# Patient Record
Sex: Male | Born: 1945 | Race: White | Hispanic: No | Marital: Married | Smoking: Never smoker
Health system: Southern US, Community
[De-identification: ages and names within clinical notes are randomized; demographics above are authoritative.]

## PROBLEM LIST (undated history)

## (undated) DIAGNOSIS — N400 Enlarged prostate without lower urinary tract symptoms: Secondary | ICD-10-CM

## (undated) DIAGNOSIS — N189 Chronic kidney disease, unspecified: Secondary | ICD-10-CM

## (undated) DIAGNOSIS — D45 Polycythemia vera: Secondary | ICD-10-CM

## (undated) DIAGNOSIS — E785 Hyperlipidemia, unspecified: Secondary | ICD-10-CM

## (undated) DIAGNOSIS — K5790 Diverticulosis of intestine, part unspecified, without perforation or abscess without bleeding: Secondary | ICD-10-CM

## (undated) HISTORY — PX: UMBILICAL HERNIA REPAIR: SHX196

## (undated) HISTORY — PX: TONSILLECTOMY: SUR1361

## (undated) HISTORY — DX: Diverticulosis of intestine, part unspecified, without perforation or abscess without bleeding: K57.90

## (undated) HISTORY — DX: Benign prostatic hyperplasia without lower urinary tract symptoms: N40.0

## (undated) HISTORY — PX: LYMPH NODE BIOPSY: SHX201

## (undated) HISTORY — DX: Hyperlipidemia, unspecified: E78.5

## (undated) HISTORY — DX: Chronic kidney disease, unspecified: N18.9

## (undated) HISTORY — DX: Polycythemia vera: D45

---

## 2020-06-26 ENCOUNTER — Other Ambulatory Visit (HOSPITAL_BASED_OUTPATIENT_CLINIC_OR_DEPARTMENT_OTHER): Payer: Self-pay | Admitting: Internal Medicine

## 2020-06-26 ENCOUNTER — Ambulatory Visit (HOSPITAL_BASED_OUTPATIENT_CLINIC_OR_DEPARTMENT_OTHER)
Admission: RE | Admit: 2020-06-26 | Discharge: 2020-06-26 | Disposition: A | Discharge status: Home / Self Care | Payer: Medicare Other | Source: Ambulatory Visit | Attending: Internal Medicine | Admitting: Internal Medicine

## 2020-06-26 ENCOUNTER — Other Ambulatory Visit: Payer: Self-pay

## 2020-06-26 DIAGNOSIS — R1031 Right lower quadrant pain: Secondary | ICD-10-CM

## 2020-06-29 ENCOUNTER — Telehealth: Payer: Self-pay | Admitting: Hematology and Oncology

## 2020-06-29 NOTE — Telephone Encounter (Signed)
Scheduled per referral. Called and spoke with pt, confirmed 6/8 appts. Pt is aware to arrive 84mins before appt and to bring photo ID and insurance card

## 2020-07-12 ENCOUNTER — Inpatient Hospital Stay: Payer: Medicare Other

## 2020-07-12 ENCOUNTER — Other Ambulatory Visit: Payer: Self-pay

## 2020-07-12 ENCOUNTER — Other Ambulatory Visit: Payer: Self-pay | Admitting: *Deleted

## 2020-07-12 ENCOUNTER — Encounter: Payer: Self-pay | Admitting: Hematology and Oncology

## 2020-07-12 ENCOUNTER — Inpatient Hospital Stay: Payer: Medicare Other | Attending: Hematology and Oncology | Admitting: Hematology and Oncology

## 2020-07-12 VITALS — BP 137/98 | HR 84 | Temp 98.3°F | Resp 18 | Wt 201.1 lb

## 2020-07-12 DIAGNOSIS — D45 Polycythemia vera: Secondary | ICD-10-CM

## 2020-07-12 DIAGNOSIS — Z79899 Other long term (current) drug therapy: Secondary | ICD-10-CM | POA: Diagnosis not present

## 2020-07-12 DIAGNOSIS — Z87438 Personal history of other diseases of male genital organs: Secondary | ICD-10-CM

## 2020-07-12 DIAGNOSIS — N189 Chronic kidney disease, unspecified: Secondary | ICD-10-CM | POA: Insufficient documentation

## 2020-07-12 LAB — IRON AND TIBC
Iron: 122 ug/dL (ref 42–163)
Saturation Ratios: 32 % (ref 20–55)
TIBC: 386 ug/dL (ref 202–409)
UIBC: 264 ug/dL (ref 117–376)

## 2020-07-12 LAB — CMP (CANCER CENTER ONLY)
ALT: 17 U/L (ref 0–44)
AST: 22 U/L (ref 15–41)
Albumin: 4.1 g/dL (ref 3.5–5.0)
Alkaline Phosphatase: 68 U/L (ref 38–126)
Anion gap: 12 (ref 5–15)
BUN: 22 mg/dL (ref 8–23)
CO2: 29 mmol/L (ref 22–32)
Calcium: 10.7 mg/dL — ABNORMAL HIGH (ref 8.9–10.3)
Chloride: 105 mmol/L (ref 98–111)
Creatinine: 1.64 mg/dL — ABNORMAL HIGH (ref 0.61–1.24)
GFR, Estimated: 44 mL/min — ABNORMAL LOW (ref >60)
Glucose, Bld: 103 mg/dL — ABNORMAL HIGH (ref 70–99)
Potassium: 4.4 mmol/L (ref 3.5–5.1)
Sodium: 146 mmol/L — ABNORMAL HIGH (ref 135–145)
Total Bilirubin: 0.9 mg/dL (ref 0.3–1.2)
Total Protein: 7.1 g/dL (ref 6.5–8.1)

## 2020-07-12 LAB — RETIC PANEL
Immature Retic Fract: 10.5 % (ref 2.3–15.9)
RBC.: 5.23 MIL/uL (ref 4.22–5.81)
Retic Count, Absolute: 60.7 10*3/uL (ref 19.0–186.0)
Retic Ct Pct: 1.2 % (ref 0.4–3.1)
Reticulocyte Hemoglobin: 35.7 pg (ref >27.9)

## 2020-07-12 LAB — CBC WITH DIFFERENTIAL (CANCER CENTER ONLY)
Abs Immature Granulocytes: 0.08 10*3/uL — ABNORMAL HIGH (ref 0.00–0.07)
Basophils Absolute: 0 10*3/uL (ref 0.0–0.1)
Basophils Relative: 1 %
Eosinophils Absolute: 0.1 10*3/uL (ref 0.0–0.5)
Eosinophils Relative: 2 %
HCT: 48 % (ref 39.0–52.0)
Hemoglobin: 16.6 g/dL (ref 13.0–17.0)
Immature Granulocytes: 2 %
Lymphocytes Relative: 26 %
Lymphs Abs: 1.3 10*3/uL (ref 0.7–4.0)
MCH: 31.7 pg (ref 26.0–34.0)
MCHC: 34.6 g/dL (ref 30.0–36.0)
MCV: 91.8 fL (ref 80.0–100.0)
Monocytes Absolute: 0.4 10*3/uL (ref 0.1–1.0)
Monocytes Relative: 8 %
Neutro Abs: 3.1 10*3/uL (ref 1.7–7.7)
Neutrophils Relative %: 61 %
Platelet Count: 287 10*3/uL (ref 150–400)
RBC: 5.23 MIL/uL (ref 4.22–5.81)
RDW: 13 % (ref 11.5–15.5)
WBC Count: 5 10*3/uL (ref 4.0–10.5)
nRBC: 0 % (ref 0.0–0.2)

## 2020-07-12 LAB — FERRITIN: Ferritin: 52 ng/mL (ref 24–336)

## 2020-07-12 NOTE — Progress Notes (Signed)
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 Carolin Sicks. Labs showed Hgb 16.5, MCV 89.8, HCT 47.9. Patient followed her for 27 years 07/12/2020: establish care with Dr. Lorenso Courier   CHIEF COMPLAINTS/PURPOSE OF CONSULTATION:  "PV "  HISTORY OF PRESENTING ILLNESS:  Frank Harper 75 y.o. male with medical history significant for polycythemia vera and chronic kidney disease who presents for establishing care.  On review of the previous records Frank Harper previously followed with [facility] oncology with Dr. Carolin Sicks in [place].  He followed with his providers for 27 years, however now he is moving to [place] and needs to establish care.  He has been managed with phlebotomy and a goal hematocrit of less than 50%.  He has never been trialed on any medications for his polycythemia vera.   On exam today Frank Harper reports that he has been offered hydroxyurea therapy in the past but that he is concerned about the increased risk of leukemia from this medication.  He also reports that he is concerned about the "spent phase" of his polycythemia vera.  His target hematocrit is 50% and he notes he maintains this with periodic phlebotomies.  He currently takes a baby aspirin once daily.  On further discussion he is a never smoker and drinks alcohol about once every 2 weeks.  He previously worked as a Education officer, museum.  His family history has no remarkable blood diseases, blood disorders, or cancer.  He is currently married with 2 children 1 of which lives in [place] and is the reason he is here.  He notes that he did have episodes of abdominal pain and recently had a CT scan performed which showed diverticulitis and kidney stones.  He otherwise denies any fevers, chills, sweats, nausea, vomiting or diarrhea.   Full 10 point ROS is listed below.  MEDICAL HISTORY:  Past Medical History:  Diagnosis Date  . CKD (chronic kidney disease)   . Enlarged prostate   . Polycythemia vera ([place])     SURGICAL HISTORY: Past Surgical History:  Procedure Laterality Date  . HERNIA REPAIR  1992  . HERNIA REPAIR  1997  . LYMPH NODE BIOPSY      SOCIAL HISTORY: Social History   Socioeconomic History  . Marital status: Married    Spouse name: Not on file  . Number of children: 2  . Years of education: Not on file  . Highest education level: Not on file  Occupational History  . Occupation: Retired Education officer, museum  Tobacco Use  . Smoking status: Never Smoker  . Smokeless tobacco: Never Used  Vaping Use  . Vaping Use: Never used  Substance and Sexual Activity  . Alcohol use: Yes    Alcohol/week: 1.0 standard drink    Types: 1 Standard drinks or equivalent per week  . Drug use: Not on file  . Sexual activity: Not on file  Other Topics Concern  . Not on file  Social History Narrative  . Not on file   Social Determinants of Health   Financial Resource Strain: Not on file  Food Insecurity: Not on file  Transportation Needs: Not on file  Physical Activity: Not on file  Stress: Not on file  Social Connections: Not on file  Intimate Partner Violence: Not on file    FAMILY HISTORY: Family History  Problem  Relation Age of Onset  . Diabetes Mother   . Aneurysm Father     ALLERGIES:  has no allergies on file.  MEDICATIONS:  Current Outpatient Medications  Medication Sig Dispense Refill  . co-enzyme Q-10 30 MG capsule Take 30 mg by mouth daily.    Marland Kitchen glucosamine-chondroitin 500-400 MG tablet Take 1 tablet by mouth daily.    . Multiple Vitamin (MULTIVITAMIN) tablet Take 1 tablet by mouth daily.    . Multiple Vitamins-Minerals (MACULAR HEALTH FORMULA PO) Take by mouth.    . Saw Palmetto, Serenoa repens, (SAW PALMETTO PO) Take by mouth.    . tamsulosin (FLOMAX) 0.4 MG CAPS capsule Take 0.4 mg by  mouth daily.     No current facility-administered medications for this visit.    REVIEW OF SYSTEMS:   Constitutional: ( - ) fevers, ( - )  chills , ( - ) night sweats Eyes: ( - ) blurriness of vision, ( - ) double vision, ( - ) watery eyes Ears, nose, mouth, throat, and face: ( - ) mucositis, ( - ) sore throat Respiratory: ( - ) cough, ( - ) dyspnea, ( - ) wheezes Cardiovascular: ( - ) palpitation, ( - ) chest discomfort, ( - ) lower extremity swelling Gastrointestinal:  ( - ) nausea, ( - ) heartburn, ( - ) change in bowel habits Skin: ( - ) abnormal skin rashes Lymphatics: ( - ) new lymphadenopathy, ( - ) easy bruising Neurological: ( - ) numbness, ( - ) tingling, ( - ) new weaknesses Behavioral/Psych: ( - ) mood change, ( - ) new changes  All other systems were reviewed with the patient and are negative.  PHYSICAL EXAMINATION: ECOG PERFORMANCE STATUS: 0 - Asymptomatic  Vitals:   07/12/20 0901  BP: (!) 137/98  Pulse: 84  Resp: 18  Temp: 98.3 F (36.8 C)  SpO2: 99%   Filed Weights   07/12/20 0901  Weight: 201 lb 1.6 oz (91.2 kg)    GENERAL: well appearing elderly Caucasian male in NAD  SKIN: skin color, texture, turgor are normal, no rashes or significant lesions EYES: conjunctiva are pink and non-injected, sclera clear LUNGS: clear to auscultation and percussion with normal breathing effort HEART: regular rate & rhythm and no murmurs and no lower extremity edema Musculoskeletal: no cyanosis of digits and no clubbing  PSYCH: alert & oriented x 3, fluent speech NEURO: no focal motor/sensory deficits  LABORATORY DATA:  I have reviewed the data as listed No flowsheet data found.  No flowsheet data found.  RADIOGRAPHIC STUDIES: CT ABDOMEN PELVIS WO CONTRAST  Result Date: 06/27/2020 CLINICAL DATA:  Right lower quadrant discomfort, tenderness, and pain for 1 month. EXAM: CT ABDOMEN AND PELVIS WITHOUT CONTRAST TECHNIQUE: Multidetector CT imaging of the abdomen and pelvis  was performed following the standard protocol without IV contrast. COMPARISON:  None. FINDINGS: Lower chest: The lung bases are clear. No acute airspace disease or pleural effusion. Heart is normal in size. Coronary artery calcifications. Trace pericardial effusion. Hepatobiliary: No focal liver abnormality is seen. No gallstones, gallbladder wall thickening, or biliary dilatation. Pancreas: No ductal dilatation or inflammation. No evidence of pancreatic mass on this unenhanced exam. Spleen: Normal in size without focal abnormality. Adrenals/Urinary Tract: Normal adrenal glands. Multiple bilateral intrarenal calculi. Largest stones in the right kidney measures 6 mm in the lower pole. Largest stone in the left kidney measures 6 mm in the lower renal pelvis. There is no hydronephrosis. No ureteral stones. Mild symmetric bilateral perinephric edema.  5 cm water density lesion in the lower left kidney consistent with cyst. No evidence of solid lesion. Urinary bladder is partially distended. There is no bladder wall thickening or stone. Stomach/Bowel: Tiny hiatal hernia. Stomach otherwise unremarkable. Normal small bowel without obstruction, wall thickening or inflammation. Normal terminal ileum. Normal appendix, series 2, image 54 moderate volume of stool throughout the colon. Diverticulosis from the splenic flexure distally. There is no inflamed diverticulum in the proximal sigmoid colon in the left lower quadrant, series 2, image 68, consistent with acute diverticulitis. No perforation or abscess. Vascular/Lymphatic: Moderate aortic atherosclerosis. No aortic aneurysm. 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 No adjacent stranding. No enlarged lymph nodes in the abdomen or pelvis. Reproductive: Prominent prostate gland spans 5.4 cm causing mild mass effect on the bladder base. Other: No free air or abscess. No free fluid. Tiny fat containing  umbilical hernia. Minimal fat in both inguinal canals. Musculoskeletal: Bilateral L5 pars interarticularis defects with grade 1 anterolisthesis of L5 on S1 and degenerative disc disease with vacuum phenomenon. IMPRESSION: 1. Acute uncomplicated diverticulitis of the proximal sigmoid colon. No perforation or abscess. 2. Bilateral nonobstructing renal calculi. No hydronephrosis or obstructive uropathy. 3. Normal appendix. 4. 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 Aortic Atherosclerosis (ICD10-I70.0). Electronically Signed   By: Keith Rake M.D.   On: 06/27/2020 21:34    ASSESSMENT & PLAN Frank Harper 75 y.o. male with medical history significant for polycythemia vera and chronic kidney disease who presents for establishing care.  After review of the labs, review of the records, and discussion with the patient the patients findings are most consistent with a longstanding polycythemia vera.  From the records I am unable to tell if the patient has a driver mutation.  As such I am ordering a JAK2 with reflex panel today as well as an erythropoietin level.  We will plan to get the patient started up on phlebotomy therapies.  He is not interested in other cytoreductive therapies such as hydroxyurea at this time given the risk for leukemia.  He also would like to maintain a higher threshold goal of a hematocrit of 50%.  Today I discussed the risks and benefits of not taking cytoreductive therapy and of having the slightly higher hematocrit that are typical target.  The patient voiced his understanding and would like to proceed with the plan previously set in place by his oncologist in [place].  #Polycythemia Vera -- Today we will order a JAK2 with reflex panel as well as an erythropoietin level in order to confirm that this is genuinely polycythemia vera and not a secondary polycythemia -- Today we will order CBC, CMP, ferritin,  and iron panel -- We will plan to start every 2 week phlebotomies until we reach target hematocrit.  Standard literature recommends hematocrit goal of less than 45%, however the patient was previously targeting a less than 50% with his previous oncologist.  He would like to continue this -- Patient declined cytoreductive therapy with hydroxyurea given the leukemia risk -- Recommend continuation of baby aspirin 81 mg p.o. daily -- Return to clinic in 6 weeks time for reevaluation.  If stable at that time can consider monthly lab draws with every 3 month clinic visits.  Orders Placed This Encounter  Procedures  . CBC with Differential (Cancer Center Only)    Standing Status:   Future  Number of Occurrences:   1    Standing Expiration Date:   07/12/2021  . CMP ([facility] only)    Standing Status:   Future    Number of Occurrences:   1    Standing Expiration Date:   07/12/2021  . Ferritin    Standing Status:   Future    Number of Occurrences:   1    Standing Expiration Date:   07/12/2021  . Iron and TIBC    Standing Status:   Future    Number of Occurrences:   1    Standing Expiration Date:   07/12/2021  . Retic Panel    Standing Status:   Future    Number of Occurrences:   1    Standing Expiration Date:   07/12/2021  . Erythropoietin    Standing Status:   Future    Number of Occurrences:   1    Standing Expiration Date:   07/12/2021  . JAK2 (INCLUDING V617F AND EXON 12), MPL,& CALR W/RFL MPN PANEL (NGS)    Standing Status:   Future    Number of Occurrences:   1    Standing Expiration Date:   07/12/2021    All questions were answered. The patient knows to call the clinic with any problems, questions or concerns.  A total of more than 60 minutes were spent on this encounter with face-to-face time and non-face-to-face time, including preparing to see the patient, ordering tests and/or medications, counseling the patient and coordination of care as outlined above.   Ledell Peoples,  MD Department of Hematology/Oncology [place] at [facility] Phone: [phone] Pager: [phone] Email: [email or web address]  07/12/2020 9:43 AM

## 2020-07-12 NOTE — Progress Notes (Signed)
ambulatory

## 2020-07-13 LAB — ERYTHROPOIETIN: Erythropoietin: 12.1 m[IU]/mL (ref 2.6–18.5)

## 2020-07-19 ENCOUNTER — Telehealth: Payer: Self-pay | Admitting: Hematology and Oncology

## 2020-07-19 LAB — JAK2 (INCLUDING V617F AND EXON 12), MPL,& CALR W/RFL MPN PANEL (NGS)

## 2020-07-19 NOTE — Telephone Encounter (Signed)
Cancelled per pt request , pt aware

## 2020-07-20 ENCOUNTER — Other Ambulatory Visit: Payer: Medicare Other

## 2020-07-25 ENCOUNTER — Telehealth: Payer: Self-pay | Admitting: Hematology and Oncology

## 2020-07-25 NOTE — Telephone Encounter (Signed)
Cancelled appts per 6/21 sch msg. Called pt, no answer. Left msg for pt to call back to r/s.

## 2020-08-03 ENCOUNTER — Other Ambulatory Visit: Payer: Medicare Other

## 2020-08-17 ENCOUNTER — Ambulatory Visit: Payer: Medicare Other

## 2020-08-17 ENCOUNTER — Ambulatory Visit: Payer: Medicare Other | Admitting: Hematology and Oncology

## 2020-08-17 ENCOUNTER — Other Ambulatory Visit: Payer: Medicare Other

## 2020-08-17 ENCOUNTER — Other Ambulatory Visit: Payer: Self-pay | Admitting: Hematology and Oncology

## 2020-08-17 DIAGNOSIS — D45 Polycythemia vera: Secondary | ICD-10-CM

## 2020-08-18 ENCOUNTER — Telehealth: Payer: Self-pay | Admitting: Hematology and Oncology

## 2020-08-18 NOTE — Telephone Encounter (Signed)
Contacted patient to rescheduled appointment, patient said he did not need nor want to rescheduled appointment, and will call back when he wants appointment.

## 2020-09-05 ENCOUNTER — Encounter: Payer: Self-pay | Admitting: Gastroenterology

## 2020-10-17 ENCOUNTER — Ambulatory Visit: Payer: Medicare Other | Admitting: Gastroenterology

## 2020-11-02 ENCOUNTER — Ambulatory Visit: Payer: Medicare Other | Admitting: Gastroenterology

## 2020-11-07 ENCOUNTER — Telehealth: Payer: Self-pay | Admitting: Hematology and Oncology

## 2020-11-07 NOTE — Telephone Encounter (Signed)
Scheduled per 10/3 sch msg, msg was left with pt.

## 2020-11-15 ENCOUNTER — Encounter: Payer: Self-pay | Admitting: Gastroenterology

## 2020-11-15 ENCOUNTER — Other Ambulatory Visit: Payer: Medicare Other

## 2020-11-15 ENCOUNTER — Ambulatory Visit (INDEPENDENT_AMBULATORY_CARE_PROVIDER_SITE_OTHER): Payer: Medicare Other | Admitting: Gastroenterology

## 2020-11-15 VITALS — BP 118/82 | HR 68 | Ht 72.0 in | Wt 201.0 lb

## 2020-11-15 DIAGNOSIS — Z86010 Personal history of colonic polyps: Secondary | ICD-10-CM

## 2020-11-15 DIAGNOSIS — R1013 Epigastric pain: Secondary | ICD-10-CM | POA: Diagnosis not present

## 2020-11-15 DIAGNOSIS — K5732 Diverticulitis of large intestine without perforation or abscess without bleeding: Secondary | ICD-10-CM

## 2020-11-15 DIAGNOSIS — K219 Gastro-esophageal reflux disease without esophagitis: Secondary | ICD-10-CM | POA: Diagnosis not present

## 2020-11-15 NOTE — Patient Instructions (Signed)
If you are age 73 or older, your body mass index should be between 23-30. Your Body mass index is 27.26 kg/m. If this is out of the aforementioned range listed, please consider follow up with your Primary Care Provider.  If you are age 45 or younger, your body mass index should be between 19-25. Your Body mass index is 27.26 kg/m. If this is out of the aformentioned range listed, please consider follow up with your Primary Care Provider.   Your provider has requested that you go to the basement level for lab work before leaving today. Press "B" on the elevator. The lab is located at the first door on the left as you exit the elevator.  The [facility] providers would like to encourage you to use [facility] to communicate with providers for non-urgent requests or questions.  Due to long hold times on the telephone, sending your provider a message by [facility] may be a faster and more efficient way to get a response.  Please allow 48 business hours for a response.  Please remember that this is for non-urgent requests.   Due to recent changes in healthcare laws, you may see the results of your imaging and laboratory studies on MyChart before your provider has had a chance to review them.  We understand that in some cases there may be results that are confusing or concerning to you. Not all laboratory results come back in the same time frame and the provider may be waiting for multiple results in order to interpret others.  Please give [place] 48 hours in order for your provider to thoroughly review all the results before contacting the office for clarification of your results.   It was a pleasure to see you today!  Thank you for trusting me with your gastrointestinal care!    Scott E.Candis Schatz, MD

## 2020-11-15 NOTE — Progress Notes (Addendum)
HPI : Mr.Imm is a pleasant 75 year old male with a history of polycythemia vera who presents to [place] to establish GI care.  He recently moved from [place] to be closer to his children.  The patient does have a history of chronic indigestion and GERD symptoms.  Symptoms consist of frequent belching after meals and occasional acid regurgitation, which sometimes awakens him at night.  He has mild epigastric discomfort in the mornings.  His nocturnal acid regurgitation is infrequent, occurring maybe once a month.  He states that he was treated with Prilosec for a long time, but then was switched to Tums, which he has subsequently stopped taking.  Currently, he is not taking anything for GERD.  He reports having an upper endoscopy about 10 years ago which was normal per the patient.  He is a non-smoker and has no family history of GI malignancy. In May of this year, he was experiencing lower abdominal pain.  Initially, the pain was localized on the right lower quadrant, then moved to involve the whole lower abdomen.  He underwent a CT scan which was notable for uncomplicated sigmoid diverticulitis.  Moderate stool burden was noted throughout the colon.  He was also found to have nonobstructing nephrolithiasis, bilaterally, with mild perinephric edema. He was not treated with any antibiotics and the pain resolved after about a week  He states that his last colonoscopy was 5 years ago.  He thinks he had a polyp removed.  He remembers being told he had diverticulosis.  He does not have those records with him currently.  He was seen by cardiology in August of this year for symptoms of exertional dyspnea.  He underwent a treadmill stress test and an echocardiogram today, both of which were unremarkable.    He is followed by Dr. Lorenso Courier of [facility] hematology for polycythemia vera.  This is currently being managed with periodic phlebotomy, as the patient has refused medical therapy.  His most recent CBC in  June 2022 showed a hematocrit of 48%, and ferritin less than 100.  His liver enzymes are normal.  The CT in May did not demonstrate any parenchymal abnormalities of the liver or hepatosplenomegaly.    Past Medical History:  Diagnosis Date   CKD (chronic kidney disease)    Diverticulosis    Enlarged prostate    HLD (hyperlipidemia)    Polycythemia vera ([facility])      Past Surgical History:  Procedure Laterality Date   LYMPH NODE BIOPSY     TONSILLECTOMY     UMBILICAL HERNIA REPAIR     Family History  Problem Relation Age of Onset   Diabetes Mother    AAA (abdominal aortic aneurysm) Father    Stroke Father    Social History   Tobacco Use   Smoking status: Never   Smokeless tobacco: Never  Vaping Use   Vaping Use: Never used  Substance Use Topics   Alcohol use: Not Currently    Alcohol/week: 1.0 standard drink    Types: 1 Standard drinks or equivalent per week   Current Outpatient Medications  Medication Sig Dispense Refill   aspirin 81 MG EC tablet Take 1 tablet by mouth daily.     Coenzyme Q10 200 MG capsule Take 1 capsule by mouth daily.     glucosamine-chondroitin 500-400 MG tablet Take 1 tablet by mouth daily.     Multiple Vitamin (MULTIVITAMIN) tablet Take 1 tablet by mouth daily.     Multiple Vitamins-Minerals (MACULAR HEALTH FORMULA PO)  Take by mouth.     Saw Palmetto, Serenoa repens, (SAW PALMETTO PO) Take by mouth.     sodium zirconium cyclosilicate (LOKELMA) 5 g packet Take 5 g by mouth once a week.     tamsulosin (FLOMAX) 0.4 MG CAPS capsule Take 0.4 mg by mouth daily.     No current facility-administered medications for this visit.   Not on File   Review of Systems: All systems reviewed and negative except where noted in HPI.    No results found.  Physical Exam: BP 118/82 (BP Location: Left Arm, Patient Position: Sitting, Cuff Size: Normal)   Pulse 68 Comment: irregular  Ht 6' (1.829 m) Comment: pt stated height  Wt 201 lb (91.2 kg)   BMI 27.26  kg/m  Constitutional: Pleasant,well-developed, Caucasian male in no acute distress. HEENT: Normocephalic and atraumatic. Conjunctivae are normal. No scleral icterus. Cardiovascular: Normal rate, regular rhythm.  Pulmonary/chest: Effort normal and breath sounds normal. No wheezing, rales or rhonchi. Abdominal: Soft, nondistended, nontender. Bowel sounds active throughout. There are no masses palpable. No hepatomegaly. Extremities: no edema Neurological: Alert and oriented to person place and time. Skin: Skin is warm and dry. No rashes noted. Psychiatric: Normal mood and affect. Behavior is normal.  CBC    Component Value Date/Time   WBC 5.0 07/12/2020 0934   RBC 5.23 07/12/2020 0934   RBC 5.23 07/12/2020 0934   HGB 16.6 07/12/2020 0934   HCT 48.0 07/12/2020 0934   PLT 287 07/12/2020 0934   MCV 91.8 07/12/2020 0934   MCH 31.7 07/12/2020 0934   MCHC 34.6 07/12/2020 0934   RDW 13.0 07/12/2020 0934   LYMPHSABS 1.3 07/12/2020 0934   MONOABS 0.4 07/12/2020 0934   EOSABS 0.1 07/12/2020 0934   BASOSABS 0.0 07/12/2020 0934    CMP     Component Value Date/Time   NA 146 (H) 07/12/2020 0934   K 4.4 07/12/2020 0934   CL 105 07/12/2020 0934   CO2 29 07/12/2020 0934   GLUCOSE 103 (H) 07/12/2020 0934   BUN 22 07/12/2020 0934   CREATININE 1.64 (H) 07/12/2020 0934   CALCIUM 10.7 (H) 07/12/2020 0934   PROT 7.1 07/12/2020 0934   ALBUMIN 4.1 07/12/2020 0934   AST 22 07/12/2020 0934   ALT 17 07/12/2020 0934   ALKPHOS 68 07/12/2020 0934   BILITOT 0.9 07/12/2020 0934   GFRNONAA 44 (L) 07/12/2020 0934     ASSESSMENT AND PLAN: 75 year old male with infrequent typical GERD symptoms (acid regurgitation) and more frequent dyspepsia symptoms consisting of vague abdominal discomfort and belching, previously on PPI, now not on any therapy.  He does have chronic kidney disease.  His symptoms did not worsen when he weaned off his PPI, and I do not think the benefits of PPI outweigh risk to his  kidneys.  I recommend we check an H. pylori stool antigen.  I also educated the patient on the pathophysiology of belching and intragastric air.  Recommended the patient avoid carbonated beverages, chewing gum/peppermint and continue to limit coffee consumption. The patient apparently had a very mild episode of uncomplicated diverticulitis.  We will get his previous endoscopy records from his gastroenterologist in [place].  This will help [place] determine whether a repeat colonoscopy is indicated at this point.  I recommended the patient take a daily fiber supplement to help reduce his risk of recurrent diverticulitis.  Diverticulitis, sigmoid, uncomplicated - Daily Metamucil - Obtain prior colonoscopy records to help determine whether repeat colonoscopy is necessary  History of  colon polyps - Obtain prior colonoscopy records to help determine whether repeat colonoscopy is necessary  Dyspepsia/belching - H. pylori stool antigen - Education on aerophagia - No indication for EGD or daily PPI at this point  Teaghan Formica E. Candis Schatz, MD [facility]  CC: Wendall Papa, MD    Received records from Dr. Grayland Ormond Hui's office indicating that he had his last colonoscopy in 2017 showing diverticulosis throughout the entire colon, internal hemorrhoids, and a 5 mm tubular adenoma in the transverse colon.  He was recommended to repeat colonoscopy in 5 years (consistent with guideline recommendations at the time). Based on current guidelines, I would recommend he repeat colonoscopy in 7 years (2024).  Theotis Gerdeman E. Candis Schatz, MD [facility]

## 2020-11-30 ENCOUNTER — Other Ambulatory Visit: Payer: Medicare Other

## 2020-11-30 ENCOUNTER — Ambulatory Visit: Payer: Medicare Other | Admitting: Hematology and Oncology

## 2020-12-12 ENCOUNTER — Other Ambulatory Visit: Payer: Self-pay

## 2020-12-12 NOTE — Progress Notes (Signed)
Recall entered for colon in 2024 in epic.

## 2021-01-08 ENCOUNTER — Other Ambulatory Visit: Payer: Self-pay | Admitting: Nephrology

## 2021-01-08 DIAGNOSIS — N1831 Chronic kidney disease, stage 3a: Secondary | ICD-10-CM

## 2021-01-18 ENCOUNTER — Other Ambulatory Visit: Payer: Self-pay | Admitting: Nephrology

## 2021-01-18 DIAGNOSIS — N1831 Chronic kidney disease, stage 3a: Secondary | ICD-10-CM

## 2021-02-01 ENCOUNTER — Other Ambulatory Visit: Payer: Medicare Other

## 2021-02-08 ENCOUNTER — Other Ambulatory Visit: Payer: Medicare Other

## 2021-02-13 ENCOUNTER — Other Ambulatory Visit: Payer: Medicare Other

## 2021-02-22 ENCOUNTER — Other Ambulatory Visit: Payer: Medicare Other

## 2021-02-22 DIAGNOSIS — K219 Gastro-esophageal reflux disease without esophagitis: Secondary | ICD-10-CM

## 2021-02-22 DIAGNOSIS — R1013 Epigastric pain: Secondary | ICD-10-CM

## 2021-02-22 DIAGNOSIS — Z86010 Personal history of colonic polyps: Secondary | ICD-10-CM

## 2021-02-24 LAB — H. PYLORI ANTIGEN, STOOL: H pylori Ag, Stl: NEGATIVE

## 2021-02-27 ENCOUNTER — Other Ambulatory Visit: Payer: Medicare Other

## 2021-03-02 ENCOUNTER — Ambulatory Visit
Admission: RE | Admit: 2021-03-02 | Discharge: 2021-03-02 | Disposition: A | Discharge status: Home / Self Care | Payer: Medicare Other | Source: Ambulatory Visit | Attending: Nephrology | Admitting: Nephrology

## 2021-03-02 DIAGNOSIS — N1831 Chronic kidney disease, stage 3a: Secondary | ICD-10-CM

## 2021-05-07 ENCOUNTER — Encounter: Payer: Self-pay | Admitting: Gastroenterology

## 2021-05-31 ENCOUNTER — Encounter: Payer: Self-pay | Admitting: Gastroenterology

## 2021-06-04 ENCOUNTER — Other Ambulatory Visit: Payer: Self-pay | Admitting: Physician Assistant

## 2021-06-04 NOTE — Progress Notes (Signed)
Error

## 2021-11-20 IMAGING — CT CT ABD-PELV W/O CM
2 of 7 series · 15 of 46 positions shown, 17 images · non-contrast
Comparison: None.

CLINICAL DATA: Right lower quadrant discomfort, tenderness, and
pain for 1 month.

EXAM:
CT ABDOMEN AND PELVIS WITHOUT CONTRAST
TECHNIQUE: Multidetector CT imaging of the abdomen and pelvis was performed
following the standard protocol without IV contrast.

[Series 2: axial st · axial · 0.89mm/px · z∈[-613,-103]mm · 12 of 114 slices shown, 14 images]
[im 6/114  soft-tissue]
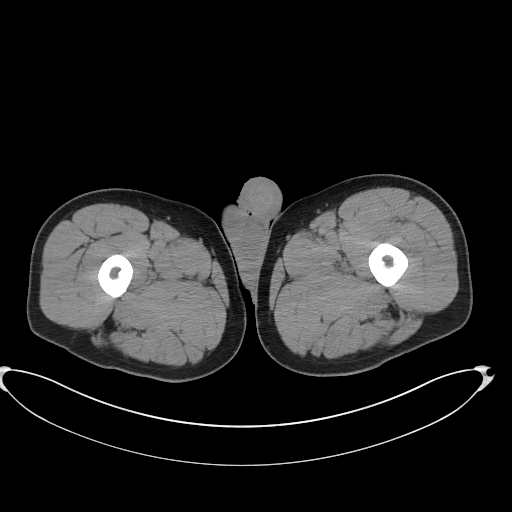
[im 6/114  bone]
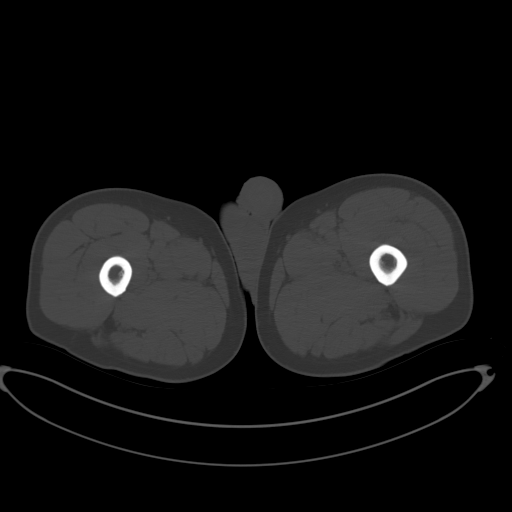
[im 17/114  soft-tissue]
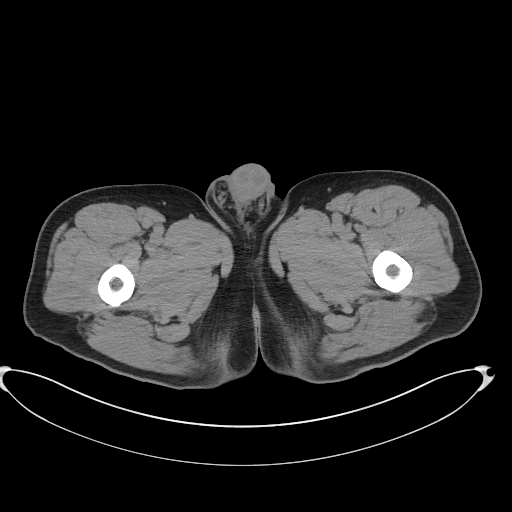
[im 27/114  soft-tissue]
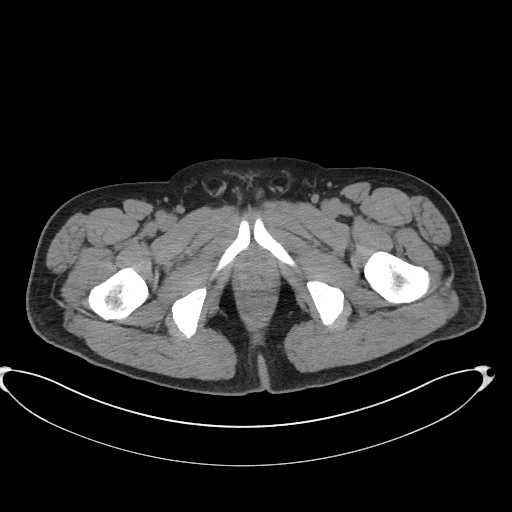
[im 33/114  soft-tissue]
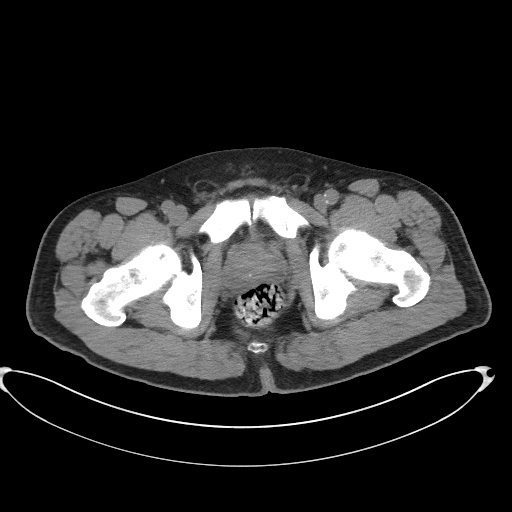
[im 44/114  soft-tissue]
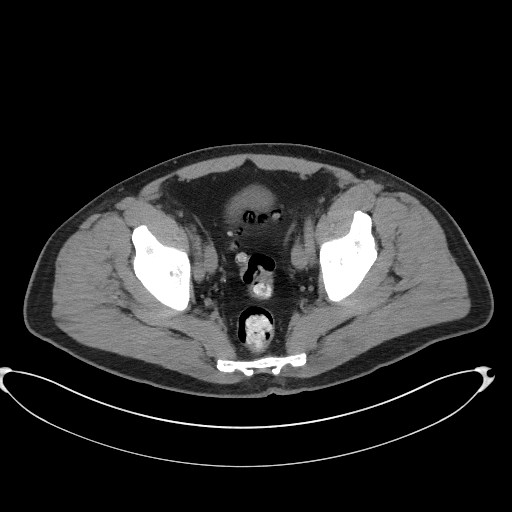
[im 54/114  soft-tissue]
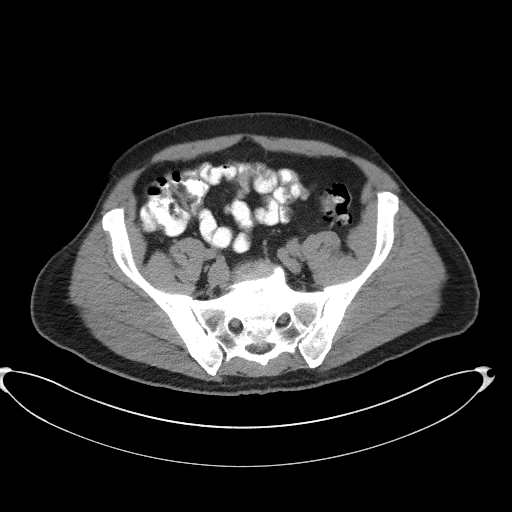
[im 60/114  soft-tissue]
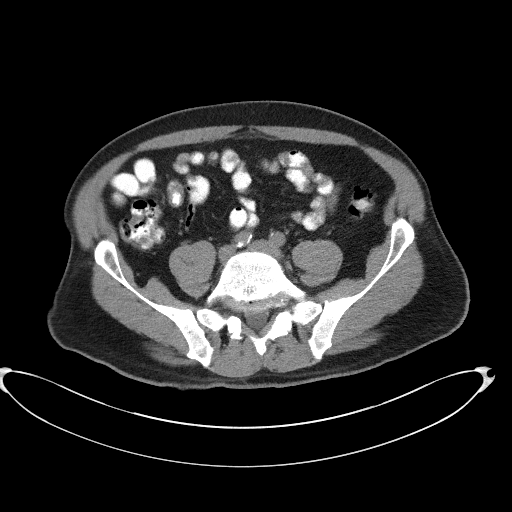
[im 70/114  soft-tissue]
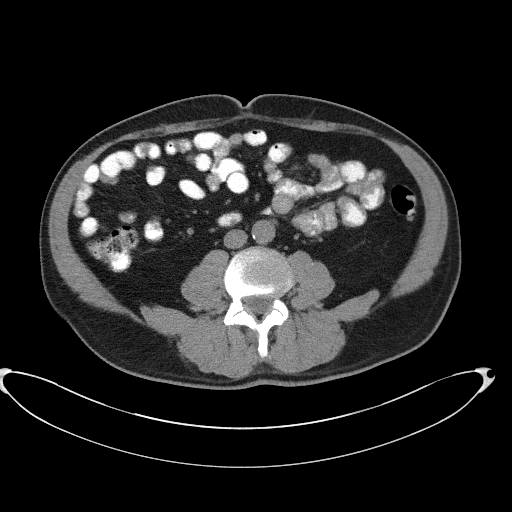
[im 81/114  soft-tissue]
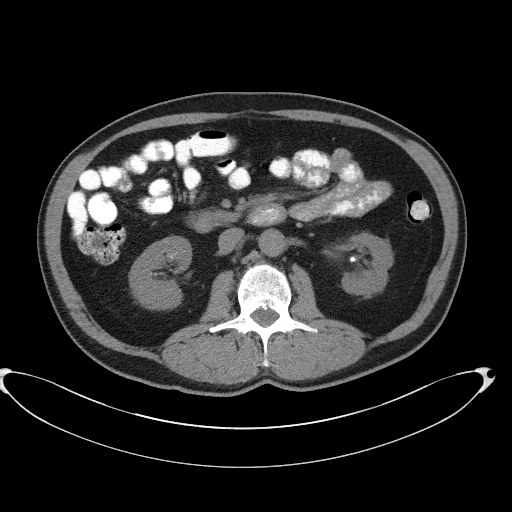
[im 81/114  bone]
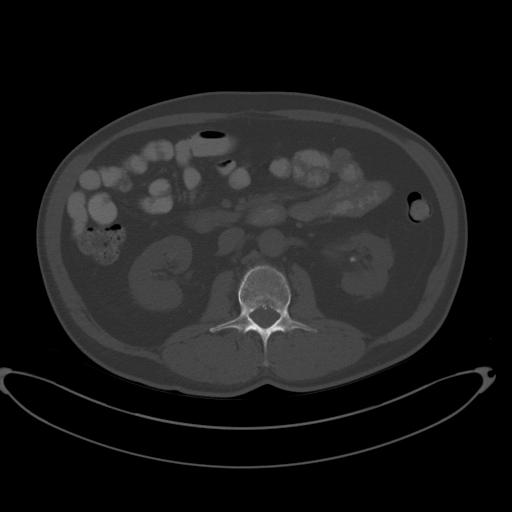
[im 87/114  soft-tissue]
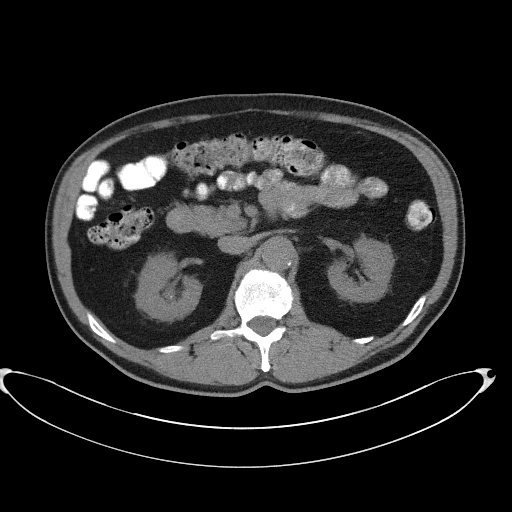
[im 97/114  soft-tissue]
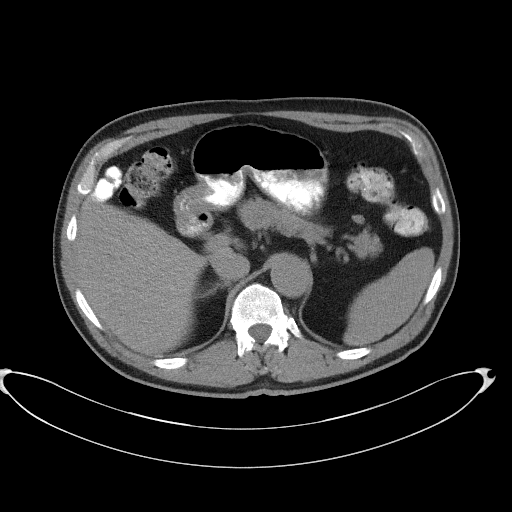
[im 108/114  soft-tissue]
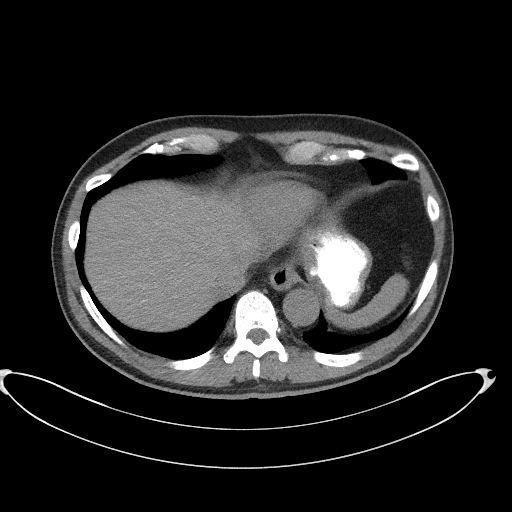

[Series 5: coronal st · coronal · 0.85mm/px · 3 of 94 slices shown]
[im 24/94  soft-tissue]
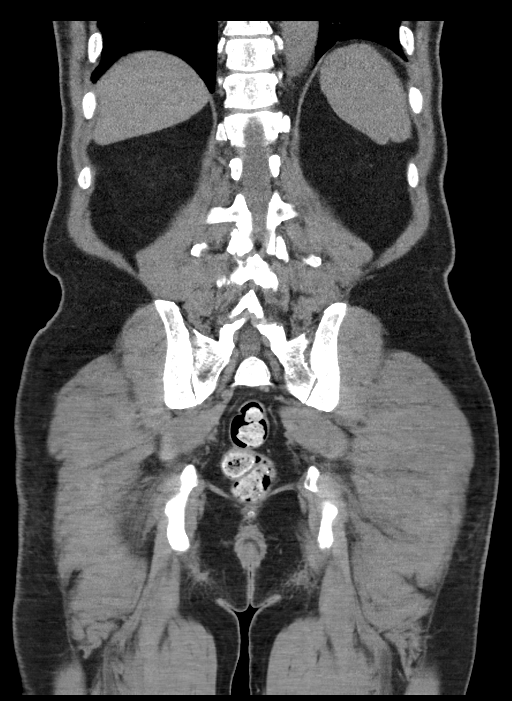
[im 47/94  soft-tissue]
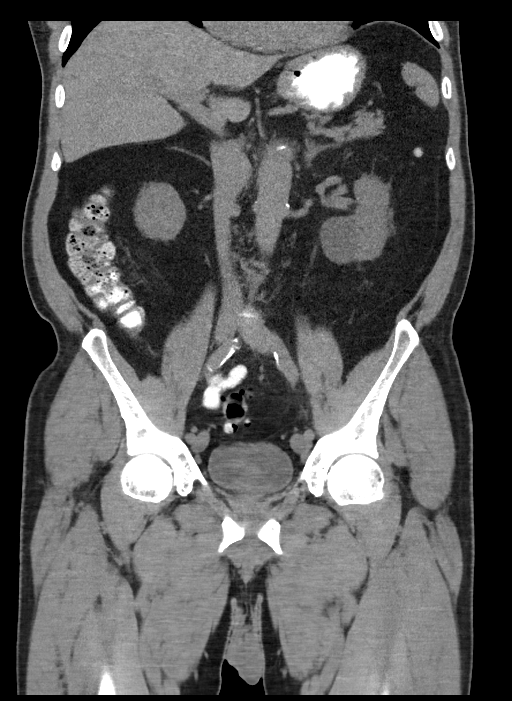
[im 70/94  soft-tissue]
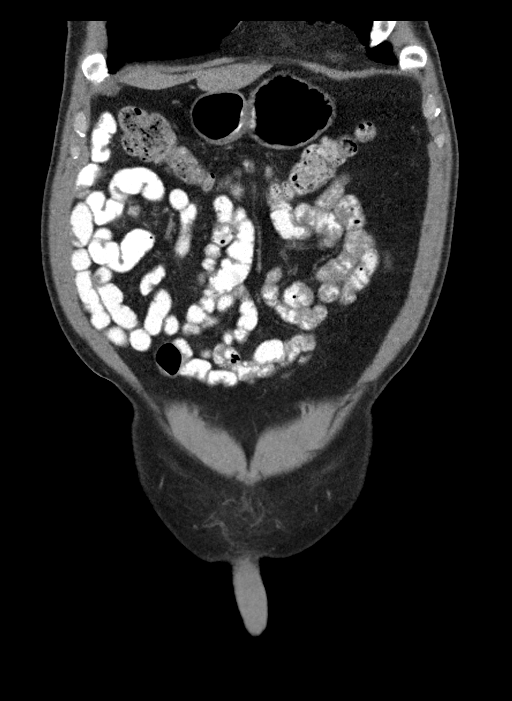

[15 of 46 positions shown; findings below may reference images not displayed]

FINDINGS: Lower chest: The lung bases are clear. No acute airspace disease or
pleural effusion. Heart is normal in size. Coronary artery
calcifications. Trace pericardial effusion.

Hepatobiliary: No focal liver abnormality is seen. No gallstones,
gallbladder wall thickening, or biliary dilatation.

Pancreas: No ductal dilatation or inflammation. No evidence of
pancreatic mass on this unenhanced exam.

Spleen: Normal in size without focal abnormality.

Adrenals/Urinary Tract: Normal adrenal glands. Multiple bilateral
intrarenal calculi. Largest stones in the right kidney measures 6 mm
in the lower pole. Largest stone in the left kidney measures 6 mm in
the lower renal pelvis. There is no hydronephrosis. No ureteral
stones. Mild symmetric bilateral perinephric edema. 5 cm water
density lesion in the lower left kidney consistent with cyst. No
evidence of solid lesion. Urinary bladder is partially distended.
There is no bladder wall thickening or stone.

Stomach/Bowel: Tiny hiatal hernia. Stomach otherwise unremarkable.
Normal small bowel without obstruction, wall thickening or
inflammation. Normal terminal ileum. Normal appendix, series 2,
image 54 moderate volume of stool throughout the colon.
Diverticulosis from the splenic flexure distally. There is no
inflamed diverticulum in the proximal sigmoid colon in the left
lower quadrant, series 2, image 68, consistent with acute
diverticulitis. No perforation or abscess.

Vascular/Lymphatic: Moderate aortic atherosclerosis. No aortic
aneurysm. Calcification of the celiac artery with fusiform
aneurysmal dilatation at 12 mm, measured on series 6, image 77. No
adjacent stranding. No enlarged lymph nodes in the abdomen or
pelvis.

Reproductive: Prominent prostate gland spans 5.4 cm causing mild
mass effect on the bladder base.

Other: No free air or abscess. No free fluid. Tiny fat containing
umbilical hernia. Minimal fat in both inguinal canals.

Musculoskeletal: Bilateral L5 pars interarticularis defects with
grade 1 anterolisthesis of L5 on S1 and degenerative disc disease
with vacuum phenomenon.
IMPRESSION: 1. Acute uncomplicated diverticulitis of the proximal sigmoid colon.
No perforation or abscess.
2. Bilateral nonobstructing renal calculi. No hydronephrosis or
obstructive uropathy.
3. Normal appendix.
4. Bilateral L5 pars interarticularis defects with grade 1
anterolisthesis of L5 on S1 and degenerative disc disease.

Aortic Atherosclerosis ([id number]).

## 2022-07-27 IMAGING — US US ABDOMEN COMPLETE
1 series · 13 of 25 positions shown · non-contrast
Comparison: None.

CLINICAL DATA: Stage III A chronic renal disease.

EXAM:
ABDOMEN ULTRASOUND COMPLETE

[Series 1: us abdomen complete · 0.17mm/px · 13 of 167 slices shown]
[im 1/167]
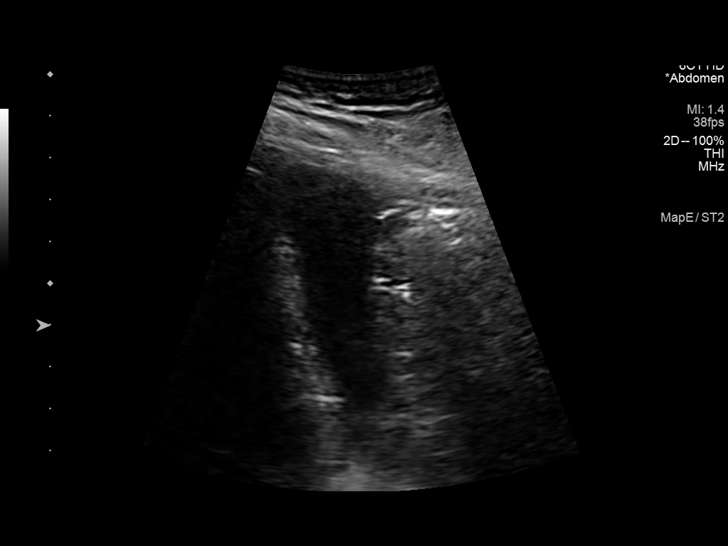
[im 14/167]
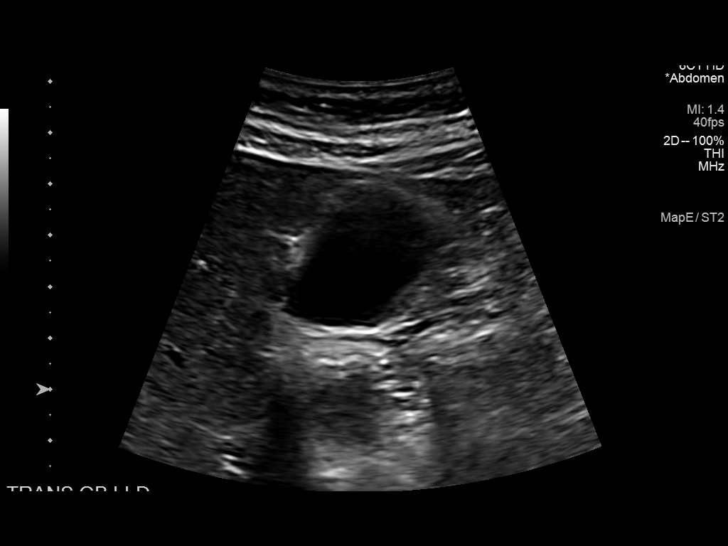
[im 28/167]
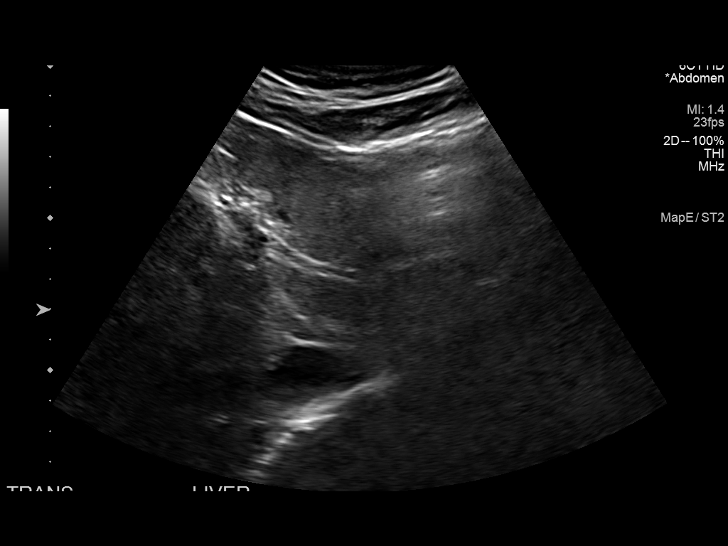
[im 42/167]
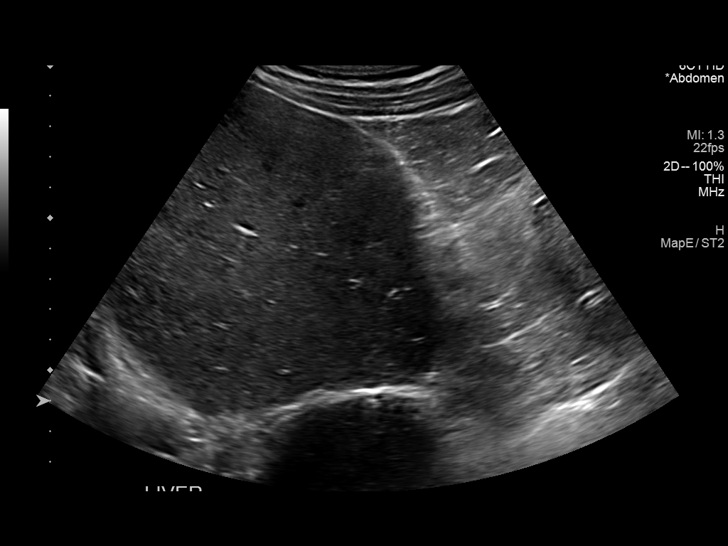
[im 56/167]
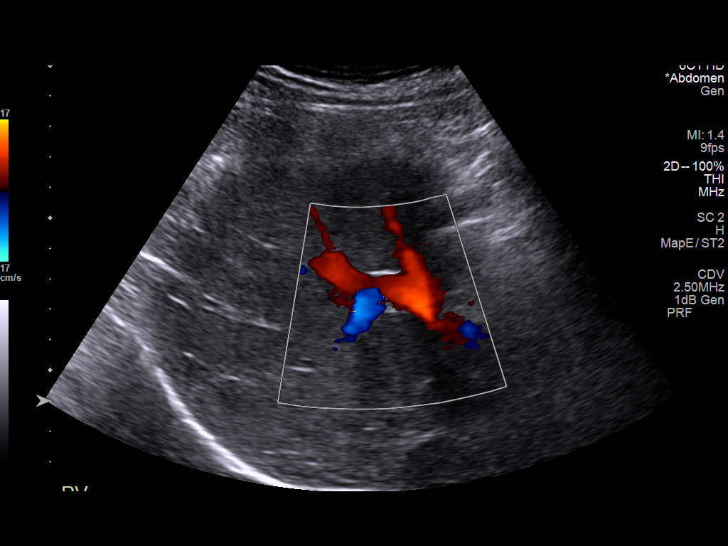
[im 70/167]
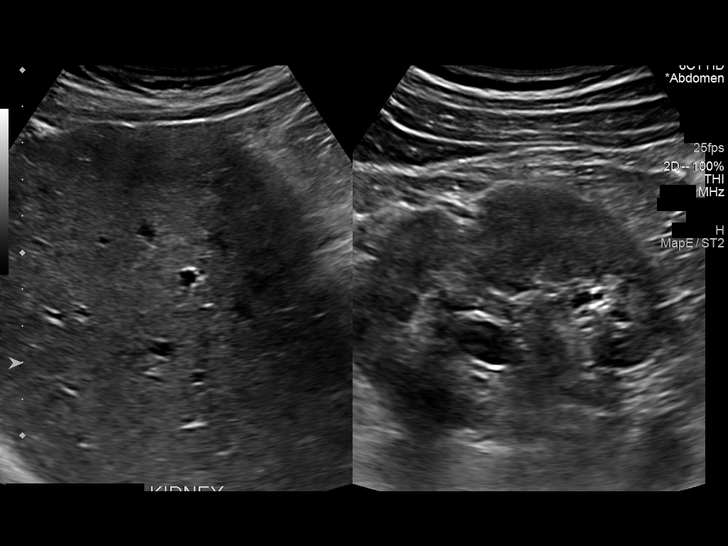
[im 84/167]
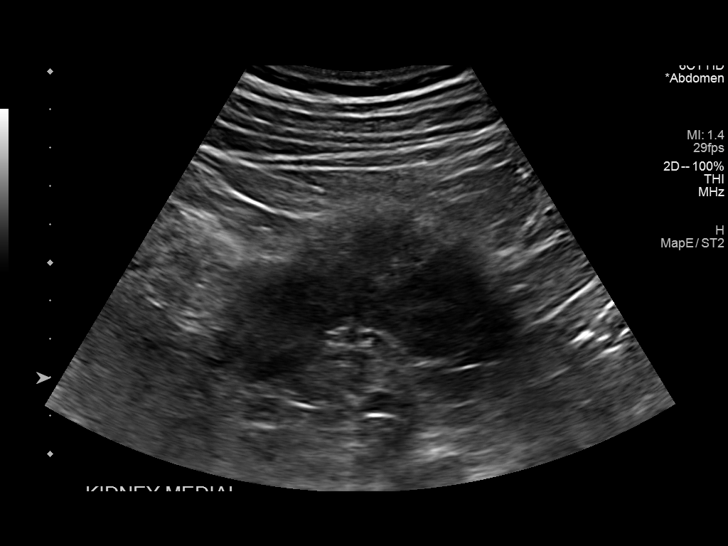
[im 97/167]
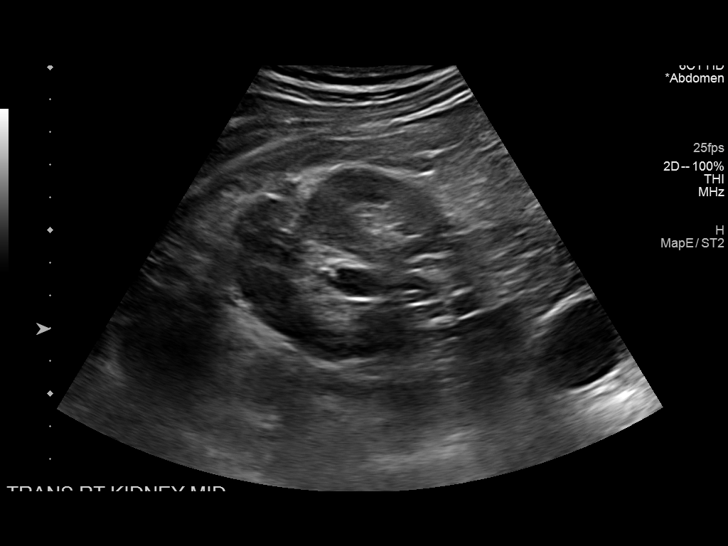
[im 111/167]
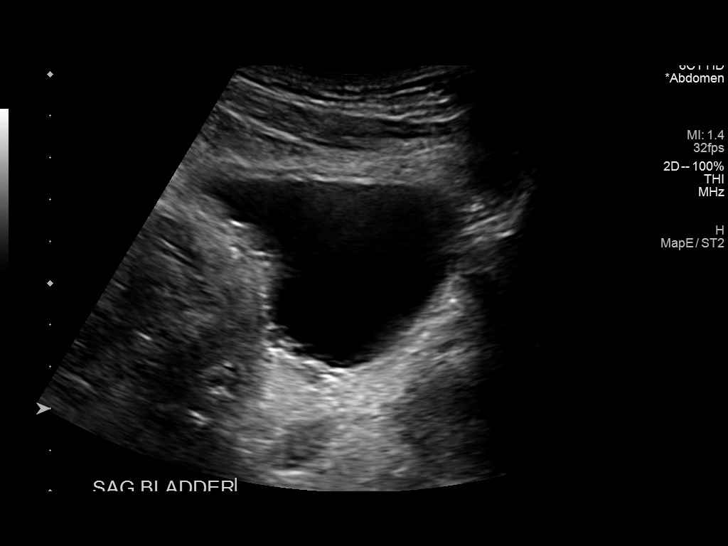
[im 125/167]
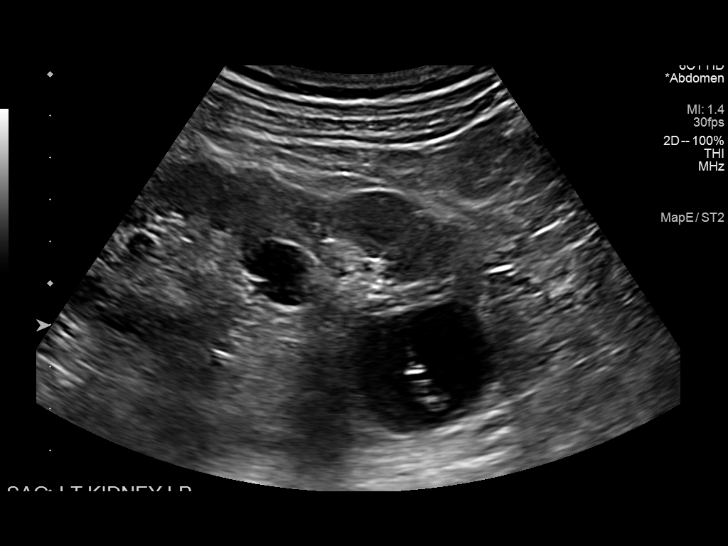
[im 139/167]
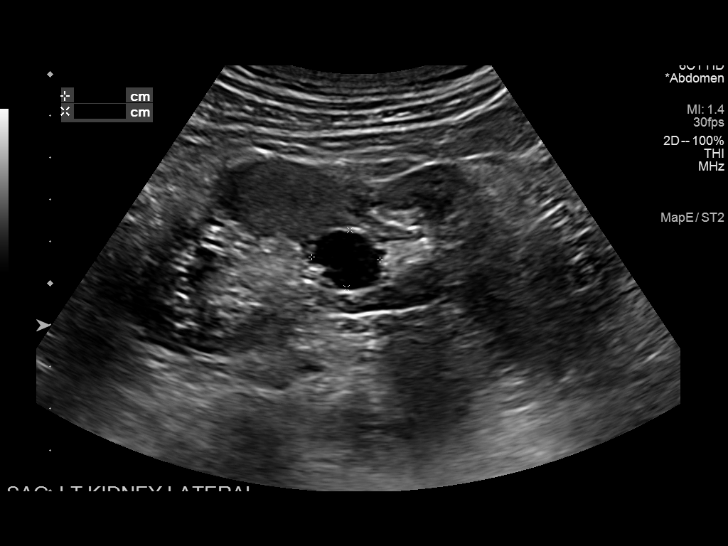
[im 153/167]
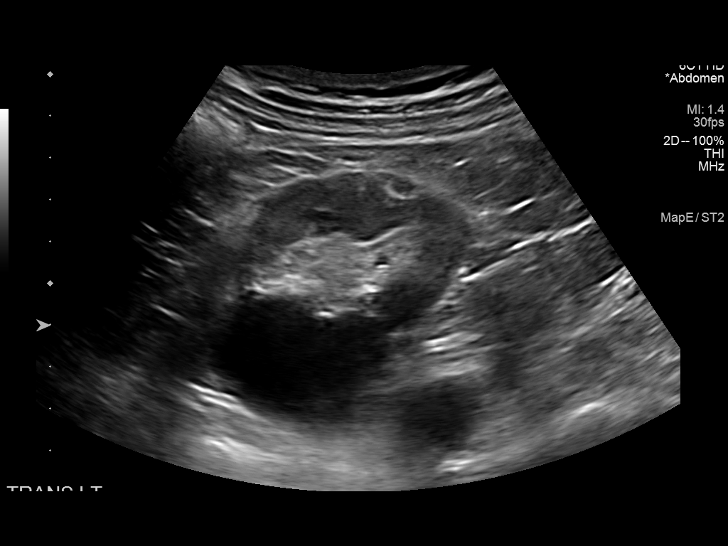
[im 167/167]
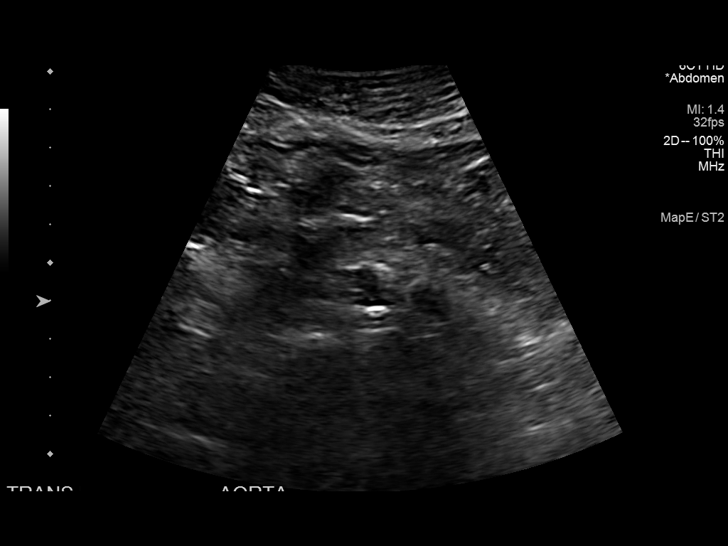

[13 of 25 positions shown; findings below may reference images not displayed]

FINDINGS: Gallbladder: No gallstones or wall thickening visualized (0.8 mm).
No sonographic Murphy sign noted by sonographer.

Common bile duct: Diameter: 3.4 mm

Liver: No focal lesion identified. The liver parenchyma is mildly
heterogeneous in appearance, but within normal limits in parenchymal
echogenicity. Portal vein is patent on color Doppler imaging with
normal direction of blood flow towards the liver.

IVC: No abnormality visualized.

Pancreas: Poorly visualized secondary to overlying bowel gas.

Spleen: Size (9.4 cm) and appearance within normal limits.

Right Kidney: Length: 8.8 cm. Echogenicity within normal limits. A
1.0 cm anechoic structure is seen within the right kidney. No
abnormal flow is seen within this region on color Doppler
evaluation. A 3 mm shadowing, echogenic right renal calculus is
noted. Mild intrarenal pelvic fullness is noted.

Left Kidney: Length: 11.1 cm. Echogenicity within normal limits.
cm and 4.4 cm anechoic structures are seen within the left kidney.
No abnormal flow is seen within these regions on color Doppler
evaluation. A 6 mm shadowing echogenic left renal calculus is noted.
There is no evidence of hydronephrosis.

Abdominal aorta: No aneurysm visualized (2.3 cm in AP diameter).

Other findings: It should be noted that the urinary bladder is very
mildly distended and subsequently limited in evaluation.

The urinary bladder is enlarged (90.3 mL volume).
IMPRESSION: 1. Bilateral renal cysts.
2. Mildly heterogeneous liver parenchyma without focal lesions.
3. Enlarged prostate gland.

## 2023-04-18 NOTE — Progress Notes (Signed)
 Assessment:  Encounter Diagnoses  Name Primary?  . Nephrolithiasis Yes  . BPH with obstruction/lower urinary tract symptoms   . History of urinary retention       Plan: 1.  Possible increase in stone burden on KUB.  Patient elects to recheck in 6  months. 2.  Continue Flomax 0.8 mg daily 3.  Continue potassium citrate 4.  Had BMP drawn by nephrologist yesterday.  Patient will inform us  if abnormal (specially if potassium high). 5.  Follow-up 6 months with KUB   Referring Physician:  No referring provider defined for this encounter.  PCP:  Pcp Not In [place]  CC: Nephrolithiasis  HPI:  78 year old male with history of nephrolithiasis and urinary retention presents for follow up.     Known bilateral renal stones up to 5 mm on CT on 5/24.  Required staged bilateral ureteroscopy in 10/23 for stones and sepsis.  Passed all other stones spontaneously.  On potassium citrate.  Stone is uric acid and calcium oxalate monohydrate.  KUB today with possible bilateral 3 to 4 mm renal stones.   IPSS is 7/2.  Significant weak stream.  Minor complete emptying, frequency and nocturia x 1.  On Flomax 0.8 mg.  Cystoscopy with visually obstructive prostatic urethra.  Prostate ultrasound 47.  PVR was 145.  Previously discussed UroLift.     LAB RESULTS REVIEWED: Most recent serum creatinine level: Lab Results  Component Value Date/Time   CREATININE 1.87 (H) 06/08/2022 04:26 AM    PSA levels: No results found for: PSA  Serum testosterone levels: No results found for: TESTOSTERONE   PMH:  Past Medical History:  Diagnosis Date  . Aortic aneurysm (CMD)      PSH: Past Surgical History:  Procedure Laterality Date  . CYSTOSCOPY W/ URETERAL STENT PLACEMENT Bilateral 10/25/2021   Procedure: CYSTOSCOPY W/ STENT PLACEMENT, bilateral retrograde pyelogram;  Surgeon: Chauncey Redell Agent, MD;  Location: [facility] MAIN OR;  Service: Urology;  Laterality: Bilateral;  . CYSTOSCOPY W/ URETEROSCOPY  Bilateral 11/15/2021   Procedure: cystoscopy, bilateral ureteroscopy, laser lithotripsy, stone manipulation, bilateral retrograde pyelograms, bilateral ureteral stent exchange;  Surgeon: Chauncey Redell Agent, MD;  Location: [facility] MAIN OR;  Service: Urology;  Laterality: Bilateral;       Medications: Current Outpatient Medications  Medication Sig Dispense Refill  . aspirin 81 mg EC tablet Take 81 mg by mouth daily.    . potassium citrate (UROCIT-K) 10 mEq (1,080 mg) CR tablet Take 10 mEq by mouth daily.    . saw palmetto 500 mg cap Take 500 mg by mouth Once Daily.    . tamsulosin (FLOMAX) 0.4 mg cap Take 2 capsules (0.8 mg total) by mouth daily. 180 capsule 3   No current facility-administered medications for this visit.    Allergies: Patient has no known allergies.   Social History: Patient  reports that he has never smoked. He has never used smokeless tobacco. He reports that he does not drink alcohol and does not use drugs.   Family History: The patient's family history is not on file.  Review of Systems: A complete review of systems was obtained including: Constitutional, Eyes, ENT, Cardiovascular, Respiratory, GI, GU, Musculoskeletal, Skin, Neurological, Psychiatric, Endocrine, Heme/Lymphatic, and Allergic/Immunologic systems. It is negative or non-contributory to the patient's management except for as stated in this note.   Physical Exam: BP 120/83 (BP Location: Right arm, Patient Position: Sitting)   Pulse 74   Temp 97.6 F (36.4 C) (Oral)   Resp 12  GENERAL APPEARANCE:  Well appearing, well developed, well nourished, NAD HEENT: Atraumatic, Normocephalic NECK: Supple without lymphadenopathy or masses. LUNGS: Clear to auscultation bilaterally, Normal  respiratory effort HEART: no obvious cyanosis ABDOMEN: Soft, non-tender, No Masses. EXTREMITIES: Moves all extremities well.  Without edema. NEUROLOGIC:  Alert and oriented x 3, CN II-XII grossly intact.  MENTAL STATUS:   Appropriate. BACK:  Non-tender to palpation.  No CVAT SKIN:  Warm, dry and intact.   GENT:  Results:   Urinalysis and laboratory results, if present, reviewed and analyzed. Recent Results (from the past 24 hours)  Urinalysis with Reflex to Microscopic   Collection Time: 04/23/23 12:59 PM  Result Value Ref Range   Color, Urine Yellow Yellow   Clarity, Urine Clear Clear   Specific Gravity, Urine 1.015 1.002 - 1.030   pH, Urine 6.0 5.0 - 8.0   Protein, Urine Negative Negative, Trace mg/dL   Glucose, Urine Negative Negative mg/dL   Ketones, Urine Negative Negative mg/dL   Bilirubin, Urine Negative Negative   Blood, Urine 1+ (A) Negative   Nitrite, Urine Negative Negative   Leukocyte Esterase, Urine Negative Negative   Urobilinogen, Urine 0.2 <2.0 mg/dL  Urinalysis, Microscopic Only   Collection Time: 04/23/23 12:59 PM  Result Value Ref Range   WBC, Urine None Seen <3 /HPF   RBC, Urine 3-8 (A) <3 /HPF   Bacteria, Urine None Seen None Seen /HPF     KUB Indication:   Hx nephrolithiasis    Findings: There is a nonspecific bowel gas pattern.   There are normal soft tissue shadows. There are normal findings of the lumbosacral spine. Location of calcifications:  Kidney: Possible bilateral 3 to 4 mm stones  Ureter:   None  Visualization is good.  IMPRESSION: 1.  Possible bilateral renal 3 to 4 mm stones

## 2023-09-16 NOTE — Progress Notes (Signed)
 Hematology/Oncology Follow-up Visit  Patient Name:  Frank Harper Date of Birth:  1945/11/27 Date of Encounter:  09/17/2023   Referring Provider:  System, Prov Not In,   PCP:  Pcp Not In [place]   Diagnoses:  Polycythemia      Hematologic/Oncologic History: Frank Harper is a 78 y.o. with polycythemia. Bone marrow biopsy on 06/11/2022 showed hypercellular bone marrow (40-50%) with increased trilineage hematopoiesis and no increase in fibrosis. The next generation sequencing myeloid panel showed no evidence of a JAK2 or Exon 12 mutation which makes the diagnosis of polycythemia vera unlikely. Review of the chart showed a normal erythropoietin  level in the past but no recent results. Given the absence of a JAK2 mutation and a normal erythropoietin  level, the criteria for polycythemia are not met. The panel did show mutations in SRSF2 and TET2 which may be seen in myeloproliferative neoplasms but are not specific. There is no increase in platelets or findings suggestive of primary myelofibrosis or chronic myelomonocytic leukemia.   His bone marrow biopsy was not consistent with polycythemia rubra vera, which is the diagnosis patient came here with. His erythropoietin  level was normal, which make p vera unlikely.   He was negative for JAK2, MPL, and CALR mutations. 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   We will do a phlebotomy whenever his hematocrit is greater than 48, per patient's preference.    Interim Note:  Frank Harper returns today for follow up of polycythemia. Frank Harper is feeling good today. He has no new concerns to report. The patient denies any symptoms such as fever, chills, night sweats, chest pain, shortness  of breath, constipation, or diarrhea. His ECOG performance status is 0.   Review of Systems: All other systems are negative.  Current Outpatient Medications:  .  aspirin 81 mg EC tablet, Take 81 mg by mouth  daily., Disp: , Rfl:  .  saw palmetto 500 mg cap, Take 500 mg by mouth Once Daily., Disp: , Rfl:  .  tamsulosin (FLOMAX) 0.4 mg cap, Take 2 capsules (0.8 mg total) by mouth daily., Disp: 180 capsule, Rfl: 3 Past medical history, allergies, current medications, social history and family history were reviewed and updated when appropriate.   Physical Examination: Vital Signs: Blood pressure 126/84, pulse 85, temperature 98 F (36.7 C), resp. rate 16, height 1.829 m (6'), weight 88 kg (194 lb), SpO2 98%.  General:  Pleasant male in no acute distress HEENT: Normocephalic, atraumatic. PERRLA, EOMI, sclera anicteric.  Nose without discharge.  No thrush or mucositis noted on oropharyngeal examination. Oral mucosa moist.  Pharynx had no tonsillar enlargement or exudates.  Neck supple without masses. No  thyromegaly present. Trachea midline. Lymphatic/Immunologic:  No cervical, axillary, or femoral adenopathy.  Cardiovascular:  Regular rate and rhythm, no audible murmurs, gallops, or rubs.  No JVD noted on examination.    Respiratory:  Chest clear to percussion and auscultation; no respiratory distress.  Gastrointestinal:  Abdomen soft and without tenderness; no hepatosplenomegaly or other masses. No clinical ascites. No rebound or guarding tenderness. Extremities:  No edema or suspicious rashes.  Skin:  No pathologic appearing petechiae or bruising noted.  Neurologic: Alert and oriented to person, place and circumstance.  Strength and sensation are grossly intact.  Cranial nerves III through XII grossly intact. Psychiatric:  Mood and affect are normal. Speech is fluent.  Results: Results for orders placed or performed in visit on 09/17/23  CBC with Differential   Collection Time:  09/17/23  2:09 PM  Result Value Ref Range   WBC 4.82 4.40 - 11.00 10*3/uL   RBC 5.28 4.50 - 5.90 10*6/uL   Hemoglobin 15.9 14.0 - 17.5 g/dL   Hematocrit 53.2 58.4 - 50.4 %   Mean Corpuscular Volume (MCV) 88.5 80.0 - 96.0  fL   Mean Corpuscular Hemoglobin (MCH) 30.0 27.5 - 33.2 pg   Mean Corpuscular Hemoglobin Conc (MCHC) 33.9 33.0 - 37.0 g/dL   Red Cell Distribution Width (RDW) 14.0 12.3 - 17.0 %   Platelet Count (PLT) 213 150 - 450 10*3/uL   Mean Platelet Volume (MPV) 7.6 6.8 - 10.2 fL   Neutrophils % 37 %   Lymphocytes % 52 %   Monocytes % 9 %   Eosinophils % 1 %   Basophils % 1 %   Neutrophils Absolute 1.80 1.80 - 7.80 10*3/uL   Lymphocytes # 2.50 1.00 - 4.80 10*3/uL   Monocytes # 0.40 0.00 - 0.80 10*3/uL   Eosinophils # 0.00 0.00 - 0.50 10*3/uL   Basophils # 0.00 0.00 - 0.20 10*3/uL         Assessment and Plan:   Polycythemia - he understands the risks of this, including stroke and is agreeable to phlebotomy. We will need to continue monitor his disease status. Patient wants to do phlebotomies above 48. As long as his hematocrit stays below 55, I am not worried about it.   Last phlebotomy was 08/18/2023.   Today, his hematocrit is 46. He does not need a phlebotomy.   RTC every 6 weeks to recheck his CBC. I will see him in 3 months.    In total, 20 minutes of time was spent on this encounter, greater than 50% of which was spent face-to-face with Frank Harper for the  history, physical exam, assessment and formulation of treatment plan.   This document serves as a record of services personally performed by Aida Lunger, MD. It was created on their behalf by Marlis Media, Scribe, a trained medical scribe. The creation of this record is the provider's dictation and/or activities during the visit.   Electronically signed by: Marlis Media, Scribe 09/17/2023 2:33 PM  I agree the documentation is accurate and complete.  Electronically signed by: Aida JONELLE Lunger, MD 10/02/2023 9:05 PM
